# Patient Record
Sex: Male | Born: 1940 | Hispanic: Refuse to answer | Marital: Married
Health system: Southern US, Community
[De-identification: ages and names within clinical notes are randomized; demographics above are authoritative.]

---

## 2018-02-27 ENCOUNTER — Encounter: Payer: Self-pay | Admitting: Hematology

## 2018-03-26 ENCOUNTER — Ambulatory Visit: Payer: Medicare Other | Admitting: Hematology

## 2018-03-26 ENCOUNTER — Encounter: Payer: Medicare Other | Admitting: Hematology

## 2018-03-26 ENCOUNTER — Inpatient Hospital Stay: Payer: Medicare Other | Attending: Oncology | Admitting: Oncology

## 2018-03-26 ENCOUNTER — Inpatient Hospital Stay: Payer: Medicare Other

## 2018-03-26 VITALS — BP 158/82 | HR 100 | Temp 98.8°F | Resp 18 | Ht 63.5 in | Wt 142.4 lb

## 2018-03-26 DIAGNOSIS — C7951 Secondary malignant neoplasm of bone: Secondary | ICD-10-CM | POA: Diagnosis not present

## 2018-03-26 DIAGNOSIS — C61 Malignant neoplasm of prostate: Secondary | ICD-10-CM

## 2018-03-26 DIAGNOSIS — Z5111 Encounter for antineoplastic chemotherapy: Secondary | ICD-10-CM | POA: Insufficient documentation

## 2018-03-26 DIAGNOSIS — N360 Urethral fistula: Secondary | ICD-10-CM | POA: Diagnosis not present

## 2018-03-26 DIAGNOSIS — D649 Anemia, unspecified: Secondary | ICD-10-CM | POA: Insufficient documentation

## 2018-03-26 LAB — CBC WITH DIFFERENTIAL (CANCER CENTER ONLY)
Basophils Absolute: 0 10*3/uL (ref 0.0–0.1)
Basophils Relative: 1 %
EOS PCT: 2 %
Eosinophils Absolute: 0.1 10*3/uL (ref 0.0–0.5)
HCT: 27.1 % — ABNORMAL LOW (ref 38.4–49.9)
Hemoglobin: 8.8 g/dL — ABNORMAL LOW (ref 13.0–17.1)
LYMPHS ABS: 0.7 10*3/uL — AB (ref 0.9–3.3)
Lymphocytes Relative: 14 %
MCH: 26.9 pg — AB (ref 27.2–33.4)
MCHC: 32.5 g/dL (ref 32.0–36.0)
MCV: 82.8 fL (ref 79.3–98.0)
MONO ABS: 0.5 10*3/uL (ref 0.1–0.9)
Monocytes Relative: 10 %
Neutro Abs: 3.8 10*3/uL (ref 1.5–6.5)
Neutrophils Relative %: 73 %
PLATELETS: 330 10*3/uL (ref 140–400)
RBC: 3.27 MIL/uL — ABNORMAL LOW (ref 4.20–5.82)
RDW: 21.3 % — ABNORMAL HIGH (ref 11.0–14.6)
WBC Count: 5.1 10*3/uL (ref 4.0–10.3)

## 2018-03-26 LAB — CMP (CANCER CENTER ONLY)
ALBUMIN: 2.4 g/dL — AB (ref 3.5–5.0)
ALT: 16 U/L (ref 0–44)
AST: 52 U/L — AB (ref 15–41)
Alkaline Phosphatase: 967 U/L — ABNORMAL HIGH (ref 38–126)
Anion gap: 10 (ref 5–15)
BUN: 17 mg/dL (ref 8–23)
CHLORIDE: 105 mmol/L (ref 98–111)
CO2: 25 mmol/L (ref 22–32)
Calcium: 9.1 mg/dL (ref 8.9–10.3)
Creatinine: 0.66 mg/dL (ref 0.61–1.24)
GFR, Est AFR Am: >60 mL/min (ref >60)
GFR, Estimated: >60 mL/min (ref >60)
GLUCOSE: 87 mg/dL (ref 70–99)
Potassium: 4.1 mmol/L (ref 3.5–5.1)
Sodium: 140 mmol/L (ref 135–145)
Total Bilirubin: 0.5 mg/dL (ref 0.3–1.2)
Total Protein: 8 g/dL (ref 6.5–8.1)

## 2018-03-26 NOTE — Progress Notes (Signed)
Reason for the request:    Prostate cancer  HPI: I was asked by Justin Clement, FNP  to evaluate Justin Garrison for prostate cancer.  He is a pleasant 77 year old man native of [place] where he lived the 56 of his life.  He was initially diagnosed with prostate cancer in 2001.  At that time he was living in [place] and received external beam radiation.  He developed a recurrent disease in 2005 and in 2007 and received cryoablation.  He subsequently developed metastatic bone disease and was treated with androgen deprivation therapy starting in 2012 at [facility].  He was receiving Lupron and Casodex at that time.  He had issues with urinary incontinence and had followed with urology at [facility] since that time.  He was offered a salvage cystoprostatectomy and urinary diversion at that time.  He continued his urological care in [place] and these records are not available to me at this time.  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  He has not received any treatment for his prostate cancer for the last 4 to 5 years per his report.  He recently moved to this area and currently lives in [place].  He establish care with a local primary care provider at that time.  Repeat laboratory data obtained on January 15, 2018 as part of physical examination showed a hemoglobin of 9.4 and a platelet count of 459.  His MCV was 85.  His creatinine was 0.52 with normal liver function test.  His alkaline phosphatase was elevated at 610 with an AST of 52.  Clinically, he does report lower back pain that has been intermittent in nature.  He does take ibuprofen and Tylenol which helps his pain.  He denies any neurological weakness or deficits.  He does have incontinence which is chronic in nature.  He still ambulates without any difficulties and continue to attend to activities of daily living.  He  does not report any headaches, blurry vision, syncope or seizures. Does not report any fevers, chills or sweats.  Does not report any cough, wheezing or hemoptysis.  Does not report any chest pain, palpitation, orthopnea or leg edema.  Does not report any nausea, vomiting or abdominal pain.  Does not report any constipation or diarrhea.  Does not report any skeletal complaints.    Does not report frequency, urgency or hematuria.  Does not report any skin rashes or lesions. Does not report any heat or cold intolerance.  Does not report any lymphadenopathy or petechiae.  Does not report any anxiety or depression.  Remaining review of systems is negative.   He denies any history of hypertension, diabetes or coronary disease.  Medication reviewed today include iron, prednisone taper, methocarbamol   No known allergies documented.   He has family history significant for colon cancer and alcoholism.  Social History: He denies any alcohol or tobacco abuse.    Physical exam Blood pressure (!) 158/82, pulse 100, temperature 98.8 F (37.1 C), temperature source Oral, resp. rate 18, height 5' 3.5" (1.613 m), weight 142 lb 6.4 oz (64.6 kg), SpO2 98 %.   ECOG 1  General appearance: alert and cooperative appeared without distress. Head: atraumatic without any abnormalities. Eyes: conjunctivae/corneas clear. PERRL.  Sclera anicteric. Throat: lips, mucosa, and tongue normal; without oral thrush or ulcers. Resp: clear to auscultation bilaterally without rhonchi, wheezes or dullness to percussion. Cardio: regular rate and  rhythm, S1, S2 normal, no murmur, click, rub or gallop GI: soft, non-tender; bowel sounds normal; no masses,  no organomegaly.  Colostomy and urostomy bag noted. Skin: Skin color, texture, turgor normal. No rashes or lesions.  Lymph nodes: Cervical, supraclavicular, and axillary nodes normal. Neurologic: Grossly normal without any motor, sensory or deep tendon reflexes.  Ambulates  without any difficulties. Musculoskeletal: No joint deformity or effusion.    Assessment and Plan:   77 year old man with the following:  1.  Prostate cancer: His initial diagnosis was in 2001 with a Gleason score 4+4 = 8 and unknown PSA.  He was treated initially with radiation therapy and subsequently treated with cryotherapy in 2005 and 2007.  He developed biochemical relapse and treated with androgen deprivation initially with Lupron and Casodex.  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  He has been treated with hyperbaric oxygen therapy in the past and subsequently underwent urinary and colonic diversion performed at [facility] in [place].  He establish care with a primary care provider and repeat laboratory testing in July 2019 showed mild anemia and elevated alkaline phosphatase with lower back pain.  These findings are suggestive of prostate cancer recurrence unlikely metastatic disease to the bone.  The natural course of this disease was reviewed today with the patient and his girlfriend accompanied him today.  The first step is to document his disease status at this time.  I will like to repeat laboratory testing including repeat PSA and testosterone level given the fact that he has not been treated with any androgen deprivation for at least 5 years.  Repeat imaging studies will be important at this time for staging purposes including CT scan and a bone scan.  Treatment options for advanced prostate cancer were reviewed today although these are not curative options but certainly can offer him palliation for an extended period of time.  First step is to restart androgen deprivation therapy which we will do in the immediate future.  Additional therapy may be required in the form of Zytiga, Xtandi, apalutamide or possibly chemotherapy.  These will be determined once his staging work-up is complete.  2.  Androgen deprivation: He  will start Lupron at 30 mg every 4 months in the immediate future.  Long-term complication associated with this therapy was reviewed today and is agreeable to continue.  3.  Bone pain: I will obtain imaging studies including CT scan and a bone scan and potentially MRI of his back.  He might require radiation therapy depending on the findings for better pain control.  4.  Bone directed therapy: He would be a good candidate for Xgeva for his likely metastatic prostate cancer to the bone.  He is currently finishing dental work and we will defer Delton See option for the time being.  5.  Anemia: Likely related to malignancy from prostate cancer and disease to the bone.  We will repeat his CBC and continue to monitor him for the time being.  6.  Prognosis: His disease is incurable but can be palliated for an extended period of time.  His performance status is adequate and aggressive therapy is warranted.  7.  Follow-up: We will be in the next few weeks to follow his progress.   60  minutes was spent with the patient face-to-face today.  More than 50% of time was dedicated to reviewing the natural course of his disease, reviewing imaging studies and coordinating his plan of care.  Thank you for the referral.  I had the pleasure of meeting this patient today.  A copy of this consult has been forwarded to the requesting physician.

## 2018-03-27 LAB — PROSTATE-SPECIFIC AG, SERUM (LABCORP): PROSTATE SPECIFIC AG, SERUM: 4139 ng/mL — AB (ref 0.0–4.0)

## 2018-03-27 LAB — TESTOSTERONE: TESTOSTERONE: 97 ng/dL — AB (ref 264–916)

## 2018-04-02 ENCOUNTER — Inpatient Hospital Stay: Payer: Medicare Other

## 2018-04-02 VITALS — BP 178/94 | HR 98 | Temp 98.1°F | Resp 18

## 2018-04-02 DIAGNOSIS — C61 Malignant neoplasm of prostate: Secondary | ICD-10-CM

## 2018-04-02 DIAGNOSIS — Z5111 Encounter for antineoplastic chemotherapy: Secondary | ICD-10-CM | POA: Diagnosis not present

## 2018-04-02 MED ORDER — LEUPROLIDE ACETATE (4 MONTH) 30 MG IM KIT
30.0000 mg | PACK | Freq: Once | INTRAMUSCULAR | Status: AC
  Administered 2018-04-02: 30 mg via INTRAMUSCULAR
  Filled 2018-04-02: qty 30

## 2018-04-02 NOTE — Patient Instructions (Signed)

## 2018-04-14 ENCOUNTER — Ambulatory Visit (HOSPITAL_COMMUNITY)
Admission: RE | Admit: 2018-04-14 | Discharge: 2018-04-14 | Disposition: A | Discharge status: Home / Self Care | Payer: Medicare Other | Source: Ambulatory Visit | Attending: Oncology | Admitting: Oncology

## 2018-04-14 DIAGNOSIS — K435 Parastomal hernia without obstruction or  gangrene: Secondary | ICD-10-CM | POA: Diagnosis not present

## 2018-04-14 DIAGNOSIS — Z933 Colostomy status: Secondary | ICD-10-CM | POA: Insufficient documentation

## 2018-04-14 DIAGNOSIS — C61 Malignant neoplasm of prostate: Secondary | ICD-10-CM

## 2018-04-14 DIAGNOSIS — C7801 Secondary malignant neoplasm of right lung: Secondary | ICD-10-CM | POA: Diagnosis not present

## 2018-04-14 DIAGNOSIS — C7951 Secondary malignant neoplasm of bone: Secondary | ICD-10-CM | POA: Diagnosis not present

## 2018-04-14 DIAGNOSIS — C7802 Secondary malignant neoplasm of left lung: Secondary | ICD-10-CM | POA: Insufficient documentation

## 2018-04-14 MED ORDER — TECHNETIUM TC 99M MEDRONATE IV KIT
20.0000 | PACK | Freq: Once | INTRAVENOUS | Status: AC | PRN
  Administered 2018-04-14: 20.4 via INTRAVENOUS

## 2018-04-14 MED ORDER — IOHEXOL 300 MG/ML  SOLN
100.0000 mL | Freq: Once | INTRAMUSCULAR | Status: AC | PRN
  Administered 2018-04-14: 100 mL via INTRAVENOUS

## 2018-04-28 ENCOUNTER — Inpatient Hospital Stay: Payer: Medicare Other | Attending: Oncology | Admitting: Oncology

## 2018-04-28 ENCOUNTER — Telehealth: Payer: Self-pay

## 2018-04-28 VITALS — BP 187/96 | HR 92 | Temp 98.2°F | Resp 18 | Ht 63.5 in | Wt 150.2 lb

## 2018-04-28 DIAGNOSIS — C61 Malignant neoplasm of prostate: Secondary | ICD-10-CM | POA: Diagnosis present

## 2018-04-28 DIAGNOSIS — C7951 Secondary malignant neoplasm of bone: Secondary | ICD-10-CM | POA: Diagnosis not present

## 2018-04-28 DIAGNOSIS — C7800 Secondary malignant neoplasm of unspecified lung: Secondary | ICD-10-CM | POA: Diagnosis not present

## 2018-04-28 NOTE — Progress Notes (Signed)
Hematology and Oncology Follow Up Visit  Justin Garrison Justin Garrison [id number] 12-08-1940 77 y.o. 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  This was documented in September 2019 but his initial diagnosis prostate cancer was in 2001.   Prior Therapy: He status post external beam radiation in 2001.   He developed a recurrent disease in 2005 and in 2007 and received cryoablation.   He subsequently developed metastatic bone disease and was treated with androgen deprivation therapy starting in 2012 at [facility].  He was receiving Lupron and Casodex at that time.  He had issues with urinary incontinence and had followed with urology at [facility] since that time.   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  He has not received any treatment for his prostate cancer for the last 4 to 5 years per his report.    Current therapy:  Androgen deprivation therapy with Lupron started on April 02, 2018.  He received 30 mg IM.  Interim History: Justin Garrison returns today for a follow-up visit.  Since the last visit, he received the first Lupron injection after completing his staging work-up and has tolerated it without any complications.  He reported improvement in his overall pelvic discomfort and have gained more weight.  He remains active and continues to attend activities of daily living.  He denies any urinary difficulties.  He denies any hematuria or dysuria.  Has performance status and activity level remain maintained.  He is planning and moved back to the [place] area.  He does not report any headaches, blurry vision, syncope or seizures. Does not report any fevers, chills or sweats.  Does not report any cough,  wheezing or hemoptysis.  Does not report any chest pain, palpitation, orthopnea or leg edema.  Does not report any nausea, vomiting or abdominal pain.  Does not report any constipation or diarrhea.  Does not report any skeletal complaints.    Does not report frequency, urgency or hematuria.  Does not report any skin rashes or lesions. Does not report any heat or cold intolerance.  Does not report any lymphadenopathy or petechiae.  Does not report any anxiety or depression.  Remaining review of systems is negative.    Medications: I have reviewed the patient's current medications.  Current Outpatient Medications  Medication Sig Dispense Refill  . acetaminophen (TYLENOL) 325 MG tablet Take 650 mg by mouth every 6 (six) hours as needed.    Marland Kitchen ibuprofen (ADVIL,MOTRIN) 200 MG tablet Take 400 mg by mouth every 6 (six) hours as needed for moderate pain.    . Multiple Vitamins-Iron (MULTIVITAMIN/IRON PO) Take 1 tablet by mouth daily.    . naproxen sodium (ALEVE) 220 MG tablet Take 220 mg by mouth 2 (two) times daily as needed.    . Omega-3 Fatty Acids (FISH OIL) 1000 MG CAPS Take 1 capsule by mouth every 3 (three) days.     No current facility-administered medications for this visit.      Allergies: No Known Allergies  Past Medical History, Surgical history, Social history, and Family History were reviewed and updated.  Review of Systems:  Remaining ROS negative.  Physical Exam: Blood pressure (!) 187/96, pulse 92, temperature 98.2 F (36.8 C), temperature source Oral, resp. rate 18, height 5' 3.5" (1.613 m), weight 150  lb 3.2 oz (68.1 kg), SpO2 99 %. ECOG: 1 General appearance: alert and cooperative appeared without distress. Head: Normocephalic, without obvious abnormality Oropharynx: No oral thrush or ulcers. Eyes: No scleral icterus.  Pupils are equal and round reactive to light. Lymph nodes: Cervical, supraclavicular, and axillary nodes normal. Heart:regular rate and rhythm, S1, S2  normal, no murmur, click, rub or gallop Lung:chest clear, no wheezing, rales, normal symmetric air entry Abdomin: soft, non-tender, without masses or organomegaly. Neurological: No motor, sensory deficits.  Intact deep tendon reflexes. Skin: No rashes or lesions.  No ecchymosis or petechiae. Musculoskeletal: No joint deformity or effusion. Psychiatric: Mood and affect are appropriate.    Lab Results: Lab Results  Component Value Date   WBC 5.1 03/26/2018   HGB 8.8 (L) 03/26/2018   HCT 27.1 (L) 03/26/2018   MCV 82.8 03/26/2018   PLT 330 03/26/2018     Chemistry      Component Value Date/Time   NA 140 03/26/2018 1102   K 4.1 03/26/2018 1102   CL 105 03/26/2018 1102   CO2 25 03/26/2018 1102   BUN 17 03/26/2018 1102   CREATININE 0.66 03/26/2018 1102      Component Value Date/Time   CALCIUM 9.1 03/26/2018 1102   ALKPHOS 967 (H) 03/26/2018 1102   AST 52 (H) 03/26/2018 1102   ALT 16 03/26/2018 1102   BILITOT 0.5 03/26/2018 1102     Results for ALANI, LACIVITA (MRN [id number]) as of 04/28/2018 10:15  Ref. Range 03/26/2018 11:02  Testosterone Latest Ref Range: 264 - 916 ng/dL 97 (L)  Results for Justin Garrison (MRN [id number]) as of 04/28/2018 10:15  Ref. Range 03/26/2018 11:02  Prostate Specific Ag, Serum Latest Ref Range: 0.0 - 4.0 ng/mL 4,139.0 (H)    Radiological Studies:  EXAM: CT ABDOMEN AND PELVIS WITH CONTRAST  TECHNIQUE: Multidetector CT imaging of the abdomen and pelvis was performed using the standard protocol following bolus administration of intravenous contrast.  CONTRAST:  111mL OMNIPAQUE IOHEXOL 300 MG/ML  SOLN  COMPARISON:  None.  FINDINGS: Lower chest: Innumerable pulmonary nodules at the lung bases consistent with pulmonary metastasis. Example lesion in the RIGHT lower lobe measures 15 mm (image 58/4). Example nodule in the LEFT lower lobe measures 14 mm (image 9/4). There are approximately 20 nodules at the lung base.  Hepatobiliary:  No focal hepatic lesion. No biliary duct dilatation. Gallbladder is normal. Common bile duct is normal.  Pancreas: Pancreas is normal. No ductal dilatation. No pancreatic inflammation.  Spleen: Normal spleen  Adrenals/urinary tract: Adrenal glands normal. Kidneys normal. Ureters join ileal conduit which is ill-defined. The conduit extends RIGHT lower quadrant urostomy. There is a peristomal hernia at the ileostomy site. No hydronephrosis.  Stomach/Bowel: Stomach, small-bowel cecum normal. The descending colon exits a LEFT lower quadrant colostomy. There is a peristomal hernia with a segment of nonobstructed small bowel entering the hernia sac. No bowel obstruction  Vascular/Lymphatic: Abdominal aorta is normal caliber with atherosclerotic calcification. There is no retroperitoneal or periportal lymphadenopathy. No pelvic lymphadenopathy.  Several small retroperitoneal periaortic lymph nodes are present measuring up to 9 mm.  Post prostatectomy.  No pelvic lymphadenopathy.  Reproductive: Post prostatectomy.  Post cystectomy.  Other: No free fluid.  Musculoskeletal: There is gas and fluid collection at the pubic symphysis associated with chronic fragmentation/fractures at the pubic symphysis. Small fluid collection posterior to the inferior RIGHT pubic symphysis midline measuring 14 mm (image 73/2). There is a potential fistulous connection through these gas collection in the midline pubic  symphysis which extends to the RIGHT groin (image 79/2).  Review of bone windows demonstrates extensive sclerotic bone metastasis.  IMPRESSION: 1. Multiple bilateral lower lobe pulmonary nodular metastasis. 2. RIGHT lower quadrant ileostomy/ileal conduit without complication. No hydronephrosis. 3. LEFT lower quadrant colostomy without complication. 4. There are parastomal hernias associated with the urostomy and colostomy with nonobstructed small bowel in the hernia  sacs. 5. Gas in small fluid collection midline at the pubic symphysis. There appears to be a chronic inflammation at this level. Track like like extension to the skin surface of the RIGHT inguinal region could represent a cutaneous fistula. 6. Extensive sclerotic bone metastasis.  EXAM: NUCLEAR MEDICINE WHOLE BODY BONE SCAN  TECHNIQUE: Whole body anterior and posterior images were obtained approximately 3 hours after intravenous injection of radiopharmaceutical.  RADIOPHARMACEUTICALS:  20.4 mCi Technetium-46m MDP IV  COMPARISON:  Abdomen and pelvis CT, 04/14/2018.  FINDINGS: There are numerous foci of abnormal radiotracer localization reflecting widespread metastatic disease to bone. Multiple foci are seen in the calvarium, projecting in the mandible, throughout the spine and involving all ribs, involving both clavicles and humeri, in the pelvis, both Femora, and involving the proximal tibias and fibulas and both proximal ulnas.  No activity is seen in the kidneys.  IMPRESSION: 1. Extensive, widespread metastatic disease to bone.    Impression and Plan:  77 year old man with the following issues:  1.  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  He has a documented disease with numerous bone metastasis outlined above.  His CT scan also showed multiple pulmonary nodules that is also suspicious for malignancy.  His initial diagnosis was in 2001 but has been neglected for at least the last 5 years.  The natural course of this disease was discussed today with the patient in detail.  His imaging studies were also reviewed including an elevated PSA that is over 4000 and testosterone level of 97.  Treatment options were also outlined today in detail.  Restarting androgen deprivation therapy remains the cornerstone of treating his advanced disease and that has been initiated.  Additional therapy is  needed at this time regardless at this time.  He has rather extensive disease and would benefit from additional therapy in the form of Taxotere chemotherapy or potentially Zytiga.  The rationale and risks and benefits of all these options were reviewed today in detail.  The logistics of administration of therapy was also discussed.  After discussion today, he is planning to move to the [place] area in the immediate future.  Given the extensive nature of his disease and the importance of regular follow-up, he would like to establish local oncology care in that area.  I recommended [facility] where he has been seen there in the past.  We will certainly help him establish care if he desires to.  2.  Androgen deprivation: He received a Lupron 30 mg IM on April 02, 2018 and I will be repeated every 4 months indefinitely.  3.  Bone directed therapy: He would be a candidate for Delton See given his extensive bony disease.  I recommended starting calcium and vitamin D supplements and obtaining dental clearance before doing so.  4.  Prognosis: This was discussed today in detail.  He has an incurable malignancy although disease that can be palliated for a period of time.  He is minimally symptomatic at this time but certainly can become more symptomatic from his disease if he continues to neglect his cancer.  5.  Follow-up: To be determined given his pending move to the [place] area.   25  minutes was spent with the patient face-to-face today.  More than 50% of time was dedicated to reviewing his disease status, the natural course of his illness, treatment options and coordinating plan of care.    Zola Button, MD 10/21/201910:38 AM

## 2018-04-28 NOTE — Telephone Encounter (Signed)
Printed avs and calender of upcoming appointment. Per 10/21 los 

## 2018-07-30 ENCOUNTER — Telehealth: Payer: Self-pay

## 2018-07-30 NOTE — Telephone Encounter (Signed)
Received call from patient requesting the phone numbers for MD follow up in the [place] area that he had misplaced. Provided the number of the [facility] prostate cancer program per Dr. Alen Blew at [phone]. Left this information as a phone message p[er the patient request.

## 2018-08-07 ENCOUNTER — Inpatient Hospital Stay: Payer: Medicare Other | Attending: Oncology

## 2018-08-07 ENCOUNTER — Ambulatory Visit: Payer: Medicare Other

## 2018-08-07 ENCOUNTER — Telehealth: Payer: Self-pay | Admitting: Oncology

## 2018-08-07 ENCOUNTER — Inpatient Hospital Stay (HOSPITAL_BASED_OUTPATIENT_CLINIC_OR_DEPARTMENT_OTHER): Payer: Medicare Other | Admitting: Oncology

## 2018-08-07 VITALS — BP 173/85 | HR 98 | Temp 99.0°F | Resp 18 | Ht 63.5 in | Wt 142.5 lb

## 2018-08-07 DIAGNOSIS — C7800 Secondary malignant neoplasm of unspecified lung: Secondary | ICD-10-CM | POA: Diagnosis not present

## 2018-08-07 DIAGNOSIS — C61 Malignant neoplasm of prostate: Secondary | ICD-10-CM

## 2018-08-07 DIAGNOSIS — C7951 Secondary malignant neoplasm of bone: Secondary | ICD-10-CM

## 2018-08-07 DIAGNOSIS — Z5111 Encounter for antineoplastic chemotherapy: Secondary | ICD-10-CM | POA: Insufficient documentation

## 2018-08-07 LAB — CBC WITH DIFFERENTIAL (CANCER CENTER ONLY)
Abs Immature Granulocytes: 0.02 10*3/uL (ref 0.00–0.07)
BASOS ABS: 0 10*3/uL (ref 0.0–0.1)
Basophils Relative: 0 %
EOS PCT: 3 %
Eosinophils Absolute: 0.2 10*3/uL (ref 0.0–0.5)
HCT: 27.7 % — ABNORMAL LOW (ref 39.0–52.0)
HEMOGLOBIN: 8.5 g/dL — AB (ref 13.0–17.0)
IMMATURE GRANULOCYTES: 0 %
LYMPHS ABS: 0.9 10*3/uL (ref 0.7–4.0)
LYMPHS PCT: 13 %
MCH: 26.5 pg (ref 26.0–34.0)
MCHC: 30.7 g/dL (ref 30.0–36.0)
MCV: 86.3 fL (ref 80.0–100.0)
MONOS PCT: 9 %
Monocytes Absolute: 0.6 10*3/uL (ref 0.1–1.0)
NEUTROS PCT: 75 %
NRBC: 0 % (ref 0.0–0.2)
Neutro Abs: 5.4 10*3/uL (ref 1.7–7.7)
PLATELETS: 357 10*3/uL (ref 150–400)
RBC: 3.21 MIL/uL — ABNORMAL LOW (ref 4.22–5.81)
RDW: 16.9 % — ABNORMAL HIGH (ref 11.5–15.5)
WBC Count: 7.2 10*3/uL (ref 4.0–10.5)

## 2018-08-07 LAB — CMP (CANCER CENTER ONLY)
ALBUMIN: 2.9 g/dL — AB (ref 3.5–5.0)
ALT: 31 U/L (ref 0–44)
AST: 43 U/L — ABNORMAL HIGH (ref 15–41)
Alkaline Phosphatase: 580 U/L — ABNORMAL HIGH (ref 38–126)
Anion gap: 10 (ref 5–15)
BUN: 13 mg/dL (ref 8–23)
CHLORIDE: 104 mmol/L (ref 98–111)
CO2: 24 mmol/L (ref 22–32)
CREATININE: 0.71 mg/dL (ref 0.61–1.24)
Calcium: 9.5 mg/dL (ref 8.9–10.3)
GFR, Estimated: >60 mL/min (ref >60)
GLUCOSE: 99 mg/dL (ref 70–99)
Potassium: 4.5 mmol/L (ref 3.5–5.1)
SODIUM: 138 mmol/L (ref 135–145)
Total Bilirubin: 0.3 mg/dL (ref 0.3–1.2)
Total Protein: 8.8 g/dL — ABNORMAL HIGH (ref 6.5–8.1)

## 2018-08-07 MED ORDER — LEUPROLIDE ACETATE (4 MONTH) 30 MG IM KIT
30.0000 mg | PACK | Freq: Once | INTRAMUSCULAR | Status: AC
  Administered 2018-08-07: 30 mg via INTRAMUSCULAR
  Filled 2018-08-07: qty 30

## 2018-08-07 NOTE — Telephone Encounter (Signed)
Scheduled appt per 1/30 los..  Printed calendar and avs.

## 2018-08-07 NOTE — Progress Notes (Signed)
Hematology and Oncology Follow Up Visit  Justin Garrison [id number] 08/20/1940 78 y.o. 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  He was initially diagnosed with prostate cancer in 2001.     Prior Therapy: He status post external beam radiation in 2001.   He developed a recurrent disease in 2005 and in 2007 and received cryoablation.   He subsequently developed metastatic bone disease and was treated with androgen deprivation therapy starting in 2012 at [facility].  He was receiving Lupron and Casodex at that time.  He had issues with urinary incontinence and had followed with urology at [facility] since that time.   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  He has not received any treatment for his prostate cancer for the last 4 to 5 years per his report.    Current therapy:  Lupron 30 mg every 4 months started on 04/02/2018.  Interim History: Mr. Witucki is here for a follow-up visit.  Since last visit, he reports no major changes in his health.  He tolerated Lupron without any issues at this time.  He denies any worsening hot flashes, excessive fatigue or tiredness.  His appetite is improved although he did lose weight.  He continues to be active and attends to activities of daily living.  He denies any worsening bone pain or pathological fractures.  He has relocated to the [place]/ area where he is establishing oncology care at this time.   He denied any alteration mental status, neuropathy, confusion or dizziness.  Denies any headaches or lethargy.  Denies any night sweats, weight loss or changes in appetite.  Denied orthopnea, dyspnea on exertion or chest discomfort.  Denies shortness of breath, difficulty breathing  hemoptysis or cough.  Denies any abdominal distention, nausea, early satiety or dyspepsia.  Denies any hematuria, frequency, dysuria or nocturia.  Denies any skin irritation, dryness or rash.  Denies any ecchymosis or petechiae.  Denies any lymphadenopathy or clotting.  Denies any heat or cold intolerance.  Denies any anxiety or depression.  Remaining review of system is negative.      Medications: I have reviewed the patient's current medications.  Current Outpatient Medications  Medication Sig Dispense Refill  . acetaminophen (TYLENOL) 325 MG tablet Take 650 mg by mouth every 6 (six) hours as needed.    Marland Kitchen ibuprofen (ADVIL,MOTRIN) 200 MG tablet Take 400 mg by mouth every 6 (six) hours as needed for moderate pain.    . Multiple Vitamins-Iron (MULTIVITAMIN/IRON PO) Take 1 tablet by mouth daily.    . naproxen sodium (ALEVE) 220 MG tablet Take 220 mg by mouth 2 (two) times daily as needed.    . Omega-3 Fatty Acids (FISH OIL) 1000 MG CAPS Take 1 capsule by mouth every 3 (three) days.     No current facility-administered medications for this visit.      Allergies: No Known Allergies  Past Medical History, Surgical history, Social history, and Family History were reviewed and updated.    Physical Exam: Blood pressure (!) 173/85, pulse 98, temperature 99 F (37.2 C), temperature source Oral, resp. rate 18, height 5' 3.5" (1.613 m), weight 142 lb 8 oz (64.6 kg), SpO2 100 %.   ECOG: 1   General appearance: Comfortable appearing without any discomfort Head: Normocephalic without  any trauma Oropharynx: Mucous membranes are moist and pink without any thrush or ulcers. Eyes: Pupils are equal and round reactive to light. Lymph nodes: No cervical, supraclavicular, inguinal or axillary lymphadenopathy.   Heart:regular rate and rhythm.  S1 and S2 without leg edema. Lung: Clear without any rhonchi or wheezes.  No dullness to percussion. Abdomin: Soft, nontender, nondistended with good bowel  sounds.  No hepatosplenomegaly. Musculoskeletal: No joint deformity or effusion.  Full range of motion noted. Neurological: No deficits noted on motor, sensory and deep tendon reflex exam. Skin: No petechial rash or dryness.  Appeared moist.      Lab Results: Lab Results  Component Value Date   WBC 5.1 03/26/2018   HGB 8.8 (L) 03/26/2018   HCT 27.1 (L) 03/26/2018   MCV 82.8 03/26/2018   PLT 330 03/26/2018     Chemistry      Component Value Date/Time   NA 140 03/26/2018 1102   K 4.1 03/26/2018 1102   CL 105 03/26/2018 1102   CO2 25 03/26/2018 1102   BUN 17 03/26/2018 1102   CREATININE 0.66 03/26/2018 1102      Component Value Date/Time   CALCIUM 9.1 03/26/2018 1102   ALKPHOS 967 (H) 03/26/2018 1102   AST 52 (H) 03/26/2018 1102   ALT 16 03/26/2018 1102   BILITOT 0.5 03/26/2018 1102       Impression and Plan:  78 year old man with:  1.  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   He has been noncompliant with his treatment in the past and was restarted on androgen deprivation therapy after establishing care in September 2019.  The role for additional therapy including Taxotere chemotherapy, Zytiga among others were reviewed.  He remains hesitant about systemic chemotherapy altogether and remain concerned about side effects.  He is also concerned about the cost associated with Zytiga and the possibility of taking extra medication.  After discussion today, we opted to continue with androgen deprivation therapy alone considering adding additional therapy in the future.  2.  Androgen deprivation: He is scheduled for Lupron today and will be repeated in 4 months.  3.  Bone directed therapy: Term complications associated with cancer in the bone was discussed today which includes pathological fractures and bone pain.  Delton See can relieve some of these complications and complication associated with this therapy was updated today.   These would include hypocalcemia and osteonecrosis of the jaw.  I recommended starting calcium and vitamin D supplements and obtaining dental clearance before consideration of Xgeva.  4.  Prognosis: His disease is incurable and any treatment is palliative.  His performance status remains adequate and aggressive therapy still warranted.  5.  Follow-up: We will be in 4 months for his next Lupron injection.  He is currently seeking a follow-up for his oncology care closer to where he is living in [place].  15  minutes was spent with the patient face-to-face today.  More than 50% of time was dedicated to updating the natural course of his disease, treatment options and long-term complications related to therapy.    Zola Button, MD 1/30/20209:58 AM

## 2018-08-08 LAB — PROSTATE-SPECIFIC AG, SERUM (LABCORP): PROSTATE SPECIFIC AG, SERUM: 1139 ng/mL — AB (ref 0.0–4.0)

## 2018-10-15 ENCOUNTER — Telehealth: Payer: Self-pay | Admitting: *Deleted

## 2018-10-15 NOTE — Telephone Encounter (Signed)
Justin Garrison's wife, Justin Garrison left a message stating that they are moving towards Palliative Care/Hospice. Would like a Consult for Palliative Care, demographics and a provider note faxed to [phone] [facility]. Attn Ms Radford Pax.

## 2018-10-16 ENCOUNTER — Other Ambulatory Visit: Payer: Self-pay | Admitting: *Deleted

## 2018-10-16 ENCOUNTER — Telehealth: Payer: Self-pay | Admitting: *Deleted

## 2018-10-16 DIAGNOSIS — C61 Malignant neoplasm of prostate: Secondary | ICD-10-CM

## 2018-10-16 NOTE — Telephone Encounter (Signed)
Faxed Palliative Care Consult to [facility]

## 2018-10-16 NOTE — Telephone Encounter (Signed)
No objections. Ok to make the referral.

## 2018-10-25 IMAGING — NM NM BONE WHOLE BODY
2 series · 2 of 2 positions shown · non-contrast
Comparison: Abdomen and pelvis CT, 04/14/2018.

CLINICAL DATA: Prostate cancer. Evaluate for bone metastatic
disease.

EXAM:
NUCLEAR MEDICINE WHOLE BODY BONE SCAN
TECHNIQUE: Whole body anterior and posterior images were obtained approximately
3 hours after intravenous injection of radiopharmaceutical.
RADIOPHARMACEUTICALS:  20.4 mCi 7echnetium-TTm MDP IV

[Series 1: wbr_bone_40 whole body · 2.66mm/px · 1 of 1 slices shown (1 of 2)]
[im 1/1]
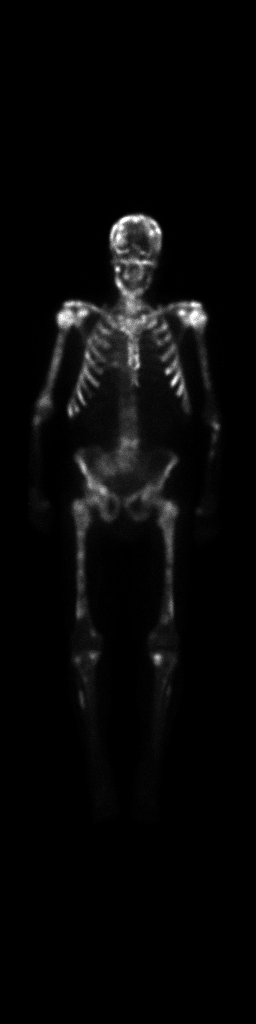

[Series 1: wbr_bone_40 whole body · 2.66mm/px · 1 of 1 slices shown (2 of 2)]
[im 1/1]
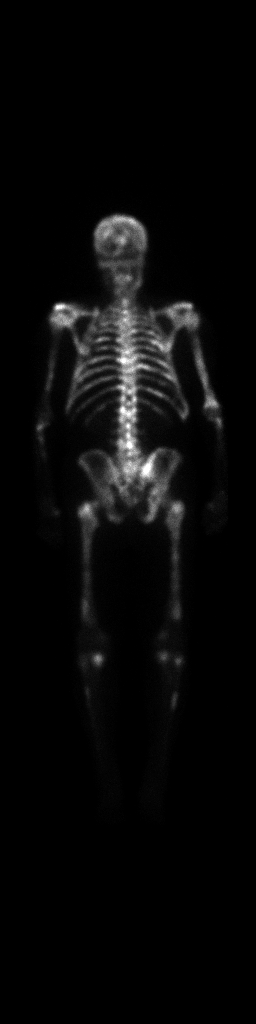

[2 of 2 positions shown; findings below may reference images not displayed]

FINDINGS: There are numerous foci of abnormal radiotracer localization
reflecting widespread metastatic disease to bone. Multiple foci are
seen in the calvarium, projecting in the mandible, throughout the
spine and involving all ribs, involving both clavicles and humeri,
in the pelvis, both Femora, and involving the proximal tibias and
fibulas and both proximal ulnas.

No activity is seen in the kidneys.
IMPRESSION: 1. Extensive, widespread metastatic disease to bone.

## 2018-11-11 ENCOUNTER — Telehealth: Payer: Self-pay | Admitting: *Deleted

## 2018-11-11 ENCOUNTER — Other Ambulatory Visit: Payer: Self-pay | Admitting: Oncology

## 2018-11-11 NOTE — Telephone Encounter (Signed)
Asking for pain medication for pain in left shoulder. States Tylenol and Advil do not work anymore and has trouble sleeping due to pain. Using new pharmacy in [place] since moving. Pharmacy preference updated. Msg sent to Dr. Alen Blew

## 2018-11-12 ENCOUNTER — Other Ambulatory Visit: Payer: Self-pay | Admitting: Oncology

## 2018-11-12 ENCOUNTER — Telehealth: Payer: Self-pay | Admitting: *Deleted

## 2018-11-12 MED ORDER — TRAMADOL HCL 50 MG PO TABS: 50.0000 mg | ORAL_TABLET | Freq: Four times a day (QID) | ORAL | 0 refills | Status: AC | PRN

## 2018-11-12 NOTE — Telephone Encounter (Signed)
Contacted patient to advise that per Dr. Alen Blew, he would not prescribe pain medication as patient is not following with him. Wife answered phone, patient also on call. Asked patient to consider contacting palliative care for pain management as notes in chart state he had been referred to them. Patient states that palliative care told him they would contact him in June, so does not think they will help.  They asked that Dr. Alen Blew receive request to reconsider, stating patient has appt in June. Msg sent to Dr. Alen Blew per patient request.

## 2018-12-03 ENCOUNTER — Telehealth: Payer: Self-pay

## 2018-12-03 NOTE — Telephone Encounter (Signed)
Patient called and wants a referral to transfer care to [facility] since he lives in [place]. Verbal consent given over the phone by patient to fax records to the cancer center. Verbal consent witnessed by Erskine Emery, RN. Records faxed to [phone]. Confirmation of fax received.

## 2018-12-12 ENCOUNTER — Inpatient Hospital Stay: Payer: Medicare Other | Admitting: Oncology

## 2018-12-12 ENCOUNTER — Inpatient Hospital Stay: Payer: Medicare Other | Attending: Family Medicine

## 2018-12-12 ENCOUNTER — Inpatient Hospital Stay: Payer: Medicare Other

## 2019-04-09 DEATH — deceased
